# Patient Record
Sex: Female | Born: 1980 | Race: Black or African American | Hispanic: No | Marital: Single
Health system: Southern US, Community
[De-identification: ages and names within clinical notes are randomized; demographics above are authoritative.]

---

## 2004-07-09 ENCOUNTER — Emergency Department (HOSPITAL_COMMUNITY): Admission: EM | Admit: 2004-07-09 | Discharge: 2004-07-09 | Payer: Self-pay | Admitting: Emergency Medicine

## 2004-07-31 ENCOUNTER — Emergency Department: Payer: Self-pay | Admitting: Emergency Medicine

## 2004-08-31 ENCOUNTER — Emergency Department: Payer: Self-pay | Admitting: Emergency Medicine

## 2004-09-01 ENCOUNTER — Emergency Department: Payer: Self-pay | Admitting: Internal Medicine

## 2004-09-03 ENCOUNTER — Emergency Department: Payer: Self-pay | Admitting: Unknown Physician Specialty

## 2005-01-11 ENCOUNTER — Emergency Department: Payer: Self-pay | Admitting: Unknown Physician Specialty

## 2005-07-30 ENCOUNTER — Emergency Department: Payer: Self-pay | Admitting: Emergency Medicine

## 2006-02-13 ENCOUNTER — Emergency Department: Payer: Self-pay | Admitting: Emergency Medicine

## 2006-06-28 ENCOUNTER — Emergency Department: Payer: Self-pay | Admitting: General Practice

## 2006-07-29 ENCOUNTER — Emergency Department: Payer: Self-pay | Admitting: Emergency Medicine

## 2007-01-27 ENCOUNTER — Emergency Department: Payer: Self-pay | Admitting: Emergency Medicine

## 2007-06-14 ENCOUNTER — Emergency Department: Payer: Self-pay | Admitting: Emergency Medicine

## 2007-09-29 ENCOUNTER — Observation Stay: Payer: Self-pay | Admitting: Obstetrics & Gynecology

## 2007-11-29 ENCOUNTER — Inpatient Hospital Stay: Payer: Self-pay

## 2008-04-06 ENCOUNTER — Emergency Department: Payer: Self-pay | Admitting: Emergency Medicine

## 2008-08-10 IMAGING — CT CT ABD-PELV W/ CM
1 of 2 series · 15 of 32 positions shown, 19 images · non-contrast
Comparison: none

REASON FOR EXAM: (1) pain; (2) pain
COMMENTS:

[Series 2: abdomen · axial · 0.60mm/px · z∈[-436,-56]mm · 15 of 84 slices shown, 19 images]
[im 4/84  soft-tissue]
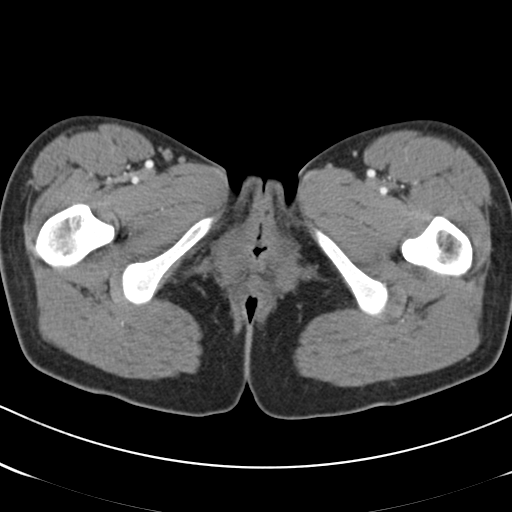
[im 4/84  bone]
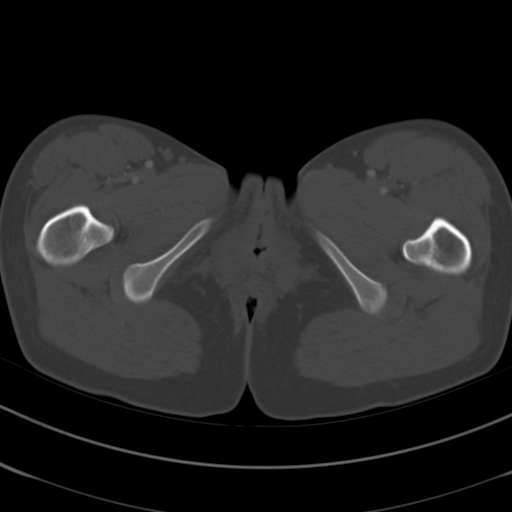
[im 10/84  soft-tissue]
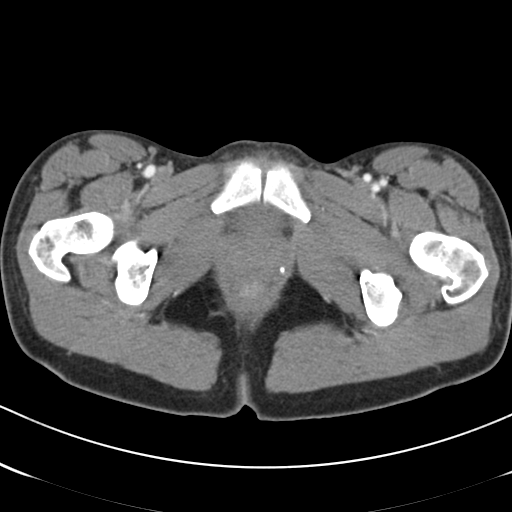
[im 16/84  soft-tissue]
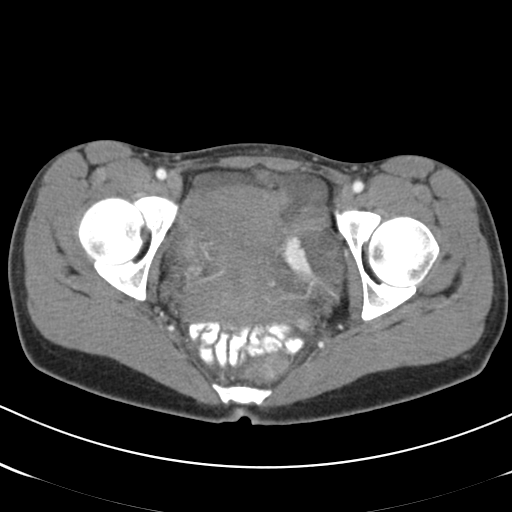
[im 23/84  soft-tissue]
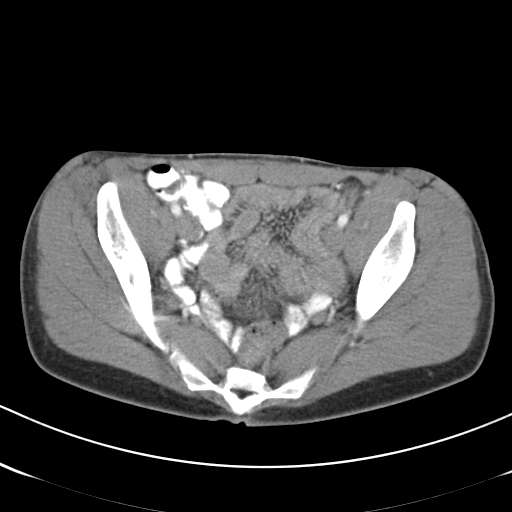
[im 29/84  soft-tissue]
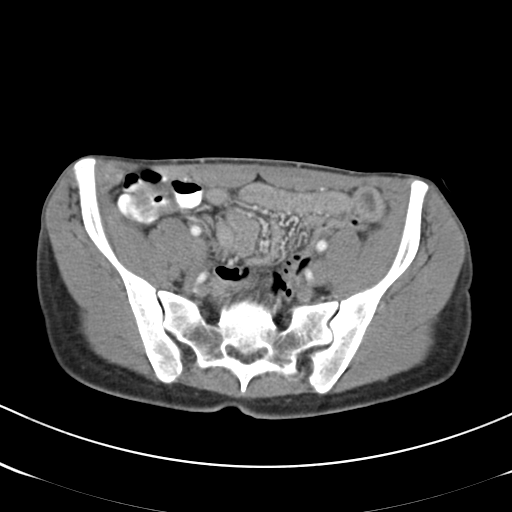
[im 36/84  soft-tissue]
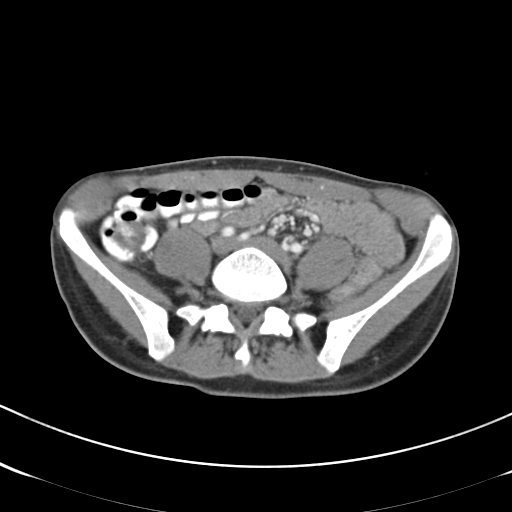
[im 42/84  soft-tissue]
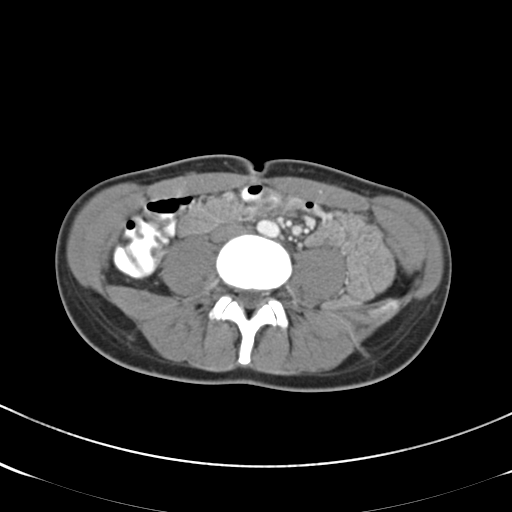
[im 48/84  soft-tissue]
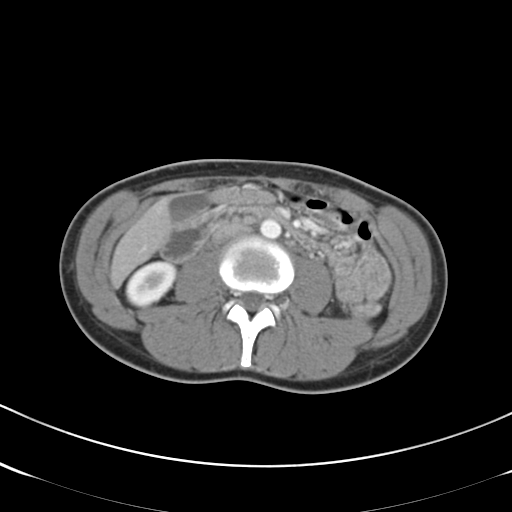
[im 55/84  soft-tissue]
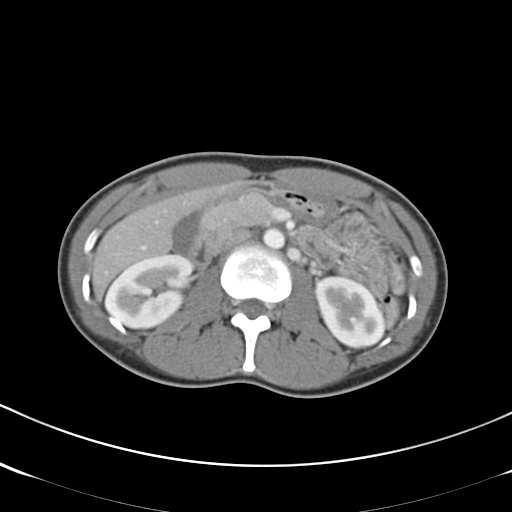
[im 55/84  bone]
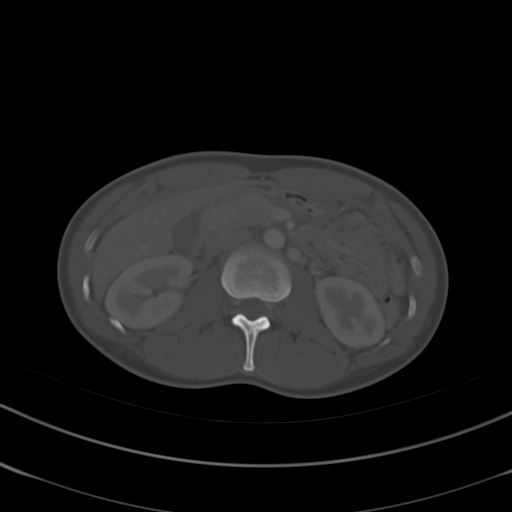
[im 61/84  soft-tissue]
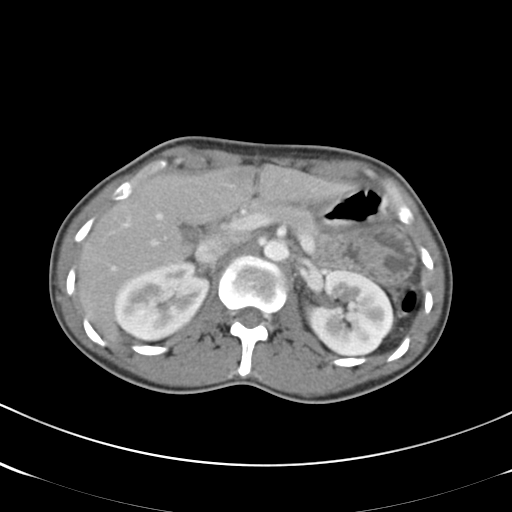
[im 68/84  soft-tissue]
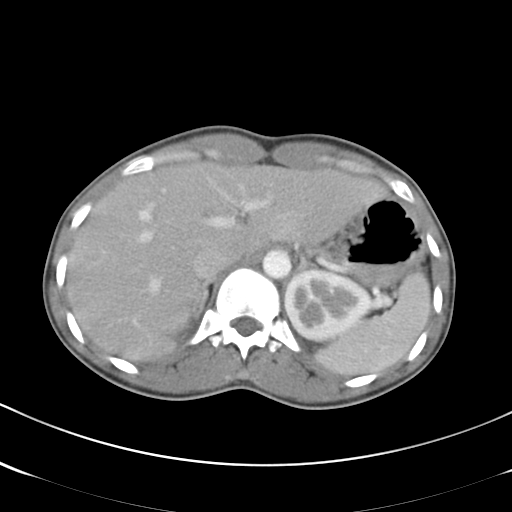
[im 71/84  lung]
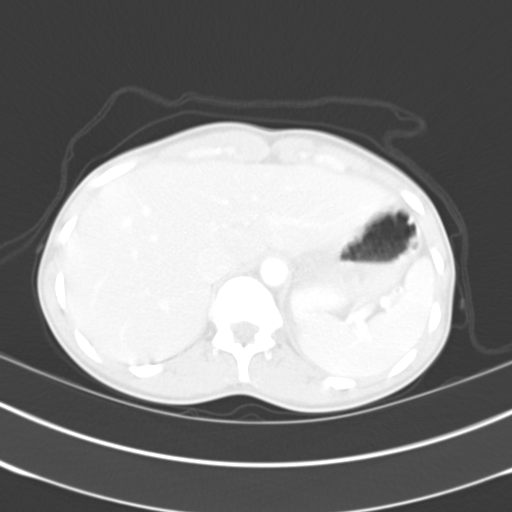
[im 74/84  soft-tissue]
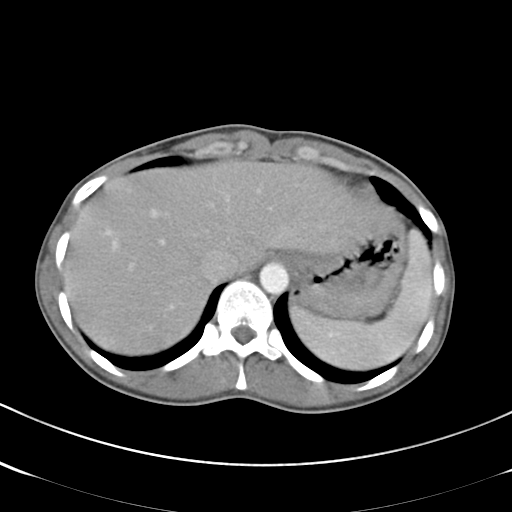
[im 74/84  lung]
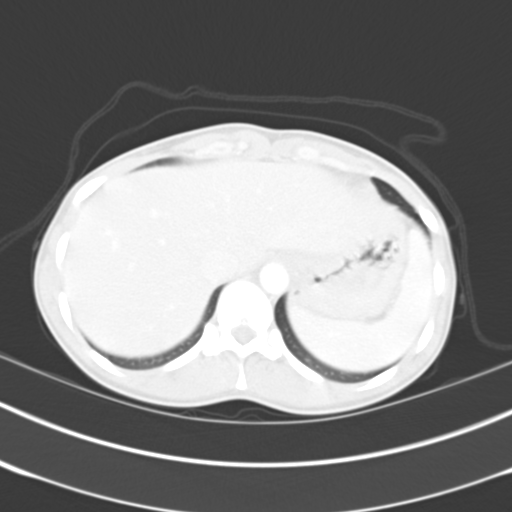
[im 77/84  lung]
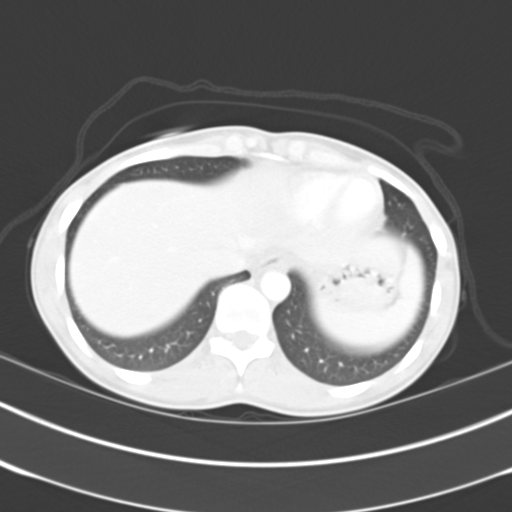
[im 80/84  soft-tissue]
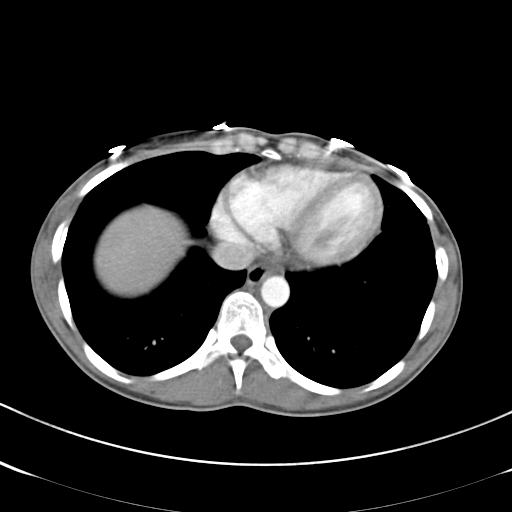
[im 80/84  lung]
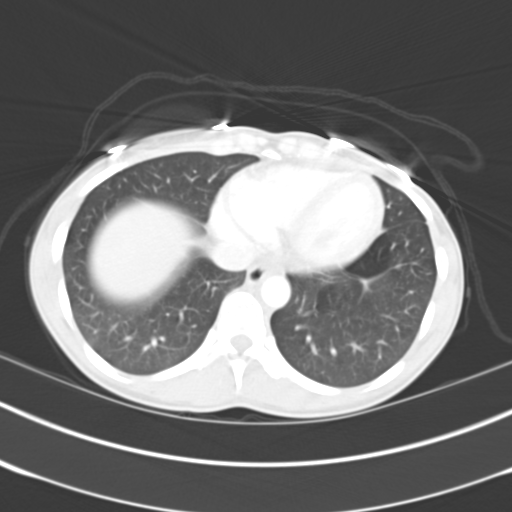

[15 of 32 positions shown; findings below may reference images not displayed]

PROCEDURE:     CT  - CT ABDOMEN / PELVIS  W  - January 27, 2007 [DATE]

RESULT:     IV contrast enhanced CT of the abdomen and pelvis obtained.  A
tiny density is noted at the LEFT lobe of the liver.  This is most likely a
benign hepatic lesion such as focal fatty infiltration or hemangioma.  MRI
of the liver is suggested for further evaluation.  The spleen is normal.
Pancreas is normal.  There is no biliary distention.  Adrenals and kidneys
are normal. Thickened bowel wall is noted thicker in the left lower
quadrant. Process such as inflammatory bowel disease or Crohn's disease,
enteritis or other bowel pathology including small and large bowel pathology
could present in this fashion.    It is difficult to determine if the small
and large bowel are involved but both may be. RIGHT lower quadrant is
unremarkable.
IMPRESSION: 1.     Thickened loops of small and possibly large bowel suggesting a
primary bowel pathology such as Crohn's disease.
2.      Small hepatic lesion in the left lobe of the liver measuring
possibly2 cm. This is most likely benign lesion such as hemangioma or focal
fatty infiltration. MRI of the liver is suggested for further evaluation.
Small bowel follow through may be useful for further evaluation of the
bowel.

## 2011-06-23 ENCOUNTER — Emergency Department: Payer: Self-pay | Admitting: Emergency Medicine

## 2011-06-23 LAB — COMPREHENSIVE METABOLIC PANEL
Alkaline Phosphatase: 58 U/L (ref 50–136)
Anion Gap: 11 (ref 7–16)
BUN: 18 mg/dL (ref 7–18)
Creatinine: 0.99 mg/dL (ref 0.60–1.30)
EGFR (African American): >60
EGFR (Non-African Amer.): >60
Glucose: 149 mg/dL — ABNORMAL HIGH (ref 65–99)
Osmolality: 284 (ref 275–301)
Potassium: 3.8 mmol/L (ref 3.5–5.1)
SGPT (ALT): 29 U/L
Sodium: 140 mmol/L (ref 136–145)
Total Protein: 9.1 g/dL — ABNORMAL HIGH (ref 6.4–8.2)

## 2011-06-23 LAB — URINALYSIS, COMPLETE
Bilirubin,UR: NEGATIVE
Glucose,UR: 150 mg/dL (ref 0–75)
Nitrite: NEGATIVE
Protein: 100
RBC,UR: 1 /HPF (ref 0–5)
Specific Gravity: 1.034 (ref 1.003–1.030)
WBC UR: 1 /HPF (ref 0–5)

## 2011-06-23 LAB — CBC WITH DIFFERENTIAL/PLATELET
Basophil #: 0 10*3/uL (ref 0.0–0.1)
Eosinophil #: 0 10*3/uL (ref 0.0–0.7)
HCT: 48.2 % — ABNORMAL HIGH (ref 35.0–47.0)
Lymphocyte #: 0.6 10*3/uL — ABNORMAL LOW (ref 1.0–3.6)
Lymphocyte %: 5.4 %
MCHC: 33.1 g/dL (ref 32.0–36.0)
MCV: 92 fL (ref 80–100)
Neutrophil #: 9.2 10*3/uL — ABNORMAL HIGH (ref 1.4–6.5)
Platelet: 202 10*3/uL (ref 150–440)
RDW: 12.8 % (ref 11.5–14.5)
WBC: 10.2 10*3/uL (ref 3.6–11.0)

## 2011-06-23 LAB — LIPASE, BLOOD: Lipase: 40 U/L — ABNORMAL LOW (ref 73–393)

## 2011-06-23 LAB — PROTIME-INR
INR: 1
Prothrombin Time: 13.7 secs (ref 11.5–14.7)

## 2011-06-23 LAB — PREGNANCY, URINE: Pregnancy Test, Urine: NEGATIVE m[IU]/mL

## 2013-12-28 ENCOUNTER — Emergency Department: Payer: Self-pay | Admitting: Emergency Medicine

## 2013-12-30 LAB — BETA STREP CULTURE(ARMC)

## 2014-07-20 ENCOUNTER — Emergency Department: Payer: Self-pay | Admitting: Emergency Medicine

## 2014-10-04 NOTE — Consult Note (Signed)
PATIENT NAME:  Patricia Day, Patricia Day MR#:  [id number] DATE OF BIRTH:  07/13/1980  DATE OF CONSULTATION:  06/23/2011  REFERRING PHYSICIAN:   CONSULTING PHYSICIAN:  Claude Manges, MD  HISTORY OF PRESENT ILLNESS: Ms. Gates is a 34 year old black female who is quite frustrated with her ER stay and it is very difficult to obtain a history from her. She rapidly spits out a few details in between multiple complaints. Despite this, history as best as I can tell, is as follows: She has had abdominal pain that is on the left side of the abdomen for approximately the last 12 years. She apparently has had a respite from that abdominal pain since she had her child, which according to her medical record was in June of 2009. She had a recurrence of her abdominal pain within the last day or so and this has been associated with nausea, vomiting, and diarrhea. Apparently she had an inpatient work-up at [facility] that included an upper endoscopy, colonoscopy, and CT scan, and the only thing that she says that they found was that she was allergic to morphine. When further questioned she does not have any hematemesis or hematochezia and she says that she has between one and three bowel movements every day. After eating anything she says she is quite likely to vomit. She is quite interested in going home.   PAST MEDICAL HISTORY: No known medical illnesses.  MEDICATIONS:  The patient takes no medications.   ALLERGIES: Morphine, sulfa drugs, and fish.   REVIEW OF SYSTEMS: Not obtained.  FAMILY HISTORY: Not obtained.  SOCIAL HISTORY: The patient has a 15-1/2 year-old child.   PHYSICAL EXAMINATION:   GENERAL: Skinny, young black female lying on the stretcher in no apparent distress. Height 5 feet 5 inches, weight 110 pounds, BMI 18.3.   VITAL SIGNS: Temperature 97.9, pulse 92, respirations 16, blood pressure 113/77, and oxygen saturation 100%. During her observational stay in the emergency room the vital  signs were stable with more recent pulse of 68, respirations 20, and blood pressure 114/80.   HEENT: Pupils are equally round and reactive to light. Extraocular movements are intact. Sclerae are anicteric. Oropharynx is clear. Mucous membranes are moist. Dentition is good.   NECK: Supple with no lymphadenopathy. The trachea is midline and there is no jugular venous distention.   HEART: Regular rate and rhythm with no murmurs or rubs.   LUNGS: Clear to auscultation with normal respiratory effort bilaterally.   ABDOMEN: Skinny and flat and almost scaphoid. The right side of the abdomen is nontender and the left side is tender to palpation to the skin, but there is no rebound or involuntary guarding. The patient states that the left side of her abdomen hurts when the right side is palpated deeply.   EXTREMITIES: No edema with normal capillary refill bilaterally.   NEUROLOGIC: Cranial nerves II through XII motor and sensation grossly intact.   PSYCHIATRIC: Alert and oriented x4. Appropriate affect.   LABS/STUDIES: CBC, complete metabolic panel, lipase, urinalysis, and urine pregnancy test all normal.   CT scan of the abdomen and pelvis with contrast reveals a possible enteric intussusception in the region of images 77 to 81, which could represent a loop of bowel around another small bowel loop given the fact that there was no discrete mass seen and no evidence of obstruction.   ASSESSMENT: I reviewed the CT scan myself and believe that in fact the patient likely high as a very short segment intussusception with  no evidence of obstruction and free flow of contrast through the intussusception. This would explain the patient's long term pain and lack of a positive evaluation on previous hospitalization.     PLAN: Since the patient is interested in being discharged, I do not see any reason for surgical emergency or admission to the surgical service today. She could be discharged with mild narcotics  such as hydrocodone, and I would be happy to follow up with her in the office. That would give me an opportunity to obtain [facility] and discuss possible diagnostic laparoscopy and possible segmental enterectomy with her. If she were to undergo laparoscopy now, in all likelihood the intussusception has already spontaneously reduced and I may not be able to find the area in question.  ____________________________ Claude MangesWilliam F. Acasia Skilton, MD [id number] D: 06/23/2011 20:05:17 ET T: 06/24/2011 10:08:32 ET JOB#: [id number]  cc: Claude MangesWilliam F. Mckynleigh Mussell, MD, <Dictator> Claude MangesWILLIAM F Zionna Homewood MD ELECTRONICALLY SIGNED 06/25/2011 20:09
